# Patient Record
Sex: Female | Born: 1970 | Race: White | Hispanic: No | Marital: Single | Smoking: Never smoker
Health system: Southern US, Community
[De-identification: ages and names within clinical notes are randomized; demographics above are authoritative.]

## PROBLEM LIST (undated history)

## (undated) HISTORY — PX: ANKLE SURGERY: SHX546

---

## 1998-11-18 ENCOUNTER — Emergency Department (HOSPITAL_COMMUNITY): Admission: EM | Admit: 1998-11-18 | Discharge: 1998-11-18 | Payer: Self-pay | Admitting: Emergency Medicine

## 2004-08-04 ENCOUNTER — Ambulatory Visit: Payer: Self-pay | Admitting: Internal Medicine

## 2004-08-23 ENCOUNTER — Ambulatory Visit: Payer: Self-pay | Admitting: Internal Medicine

## 2004-10-13 ENCOUNTER — Ambulatory Visit: Admission: RE | Admit: 2004-10-13 | Discharge: 2004-10-13 | Payer: Self-pay | Admitting: Family Medicine

## 2004-10-13 ENCOUNTER — Emergency Department (HOSPITAL_COMMUNITY): Admission: EM | Admit: 2004-10-13 | Discharge: 2004-10-13 | Payer: Self-pay | Admitting: Family Medicine

## 2007-08-23 ENCOUNTER — Inpatient Hospital Stay (HOSPITAL_COMMUNITY): Admission: EM | Admit: 2007-08-23 | Discharge: 2007-08-25 | Payer: Self-pay | Admitting: Emergency Medicine

## 2007-10-10 ENCOUNTER — Encounter: Admission: RE | Admit: 2007-10-10 | Discharge: 2007-11-05 | Payer: Self-pay | Admitting: Orthopedic Surgery
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
## 2010-09-20 ENCOUNTER — Emergency Department (HOSPITAL_BASED_OUTPATIENT_CLINIC_OR_DEPARTMENT_OTHER)
Admission: EM | Admit: 2010-09-20 | Discharge: 2010-09-20 | Payer: Self-pay | Source: Home / Self Care | Admitting: Emergency Medicine

## 2011-02-15 NOTE — Discharge Summary (Signed)
NAME:  Patricia Gallagher, Patricia Gallagher           ACCOUNT NO.:  [id number]   MEDICAL RECORD NO.:  [id number]          PATIENT TYPE:  INP   LOCATION:  5003                         FACILITY:  [facility]   PHYSICIAN:  Mila Homer. Sherlean Foot, M.D. DATE OF BIRTH:  21-Jan-1971   DATE OF ADMISSION:  08/23/2007  DATE OF DISCHARGE:  08/25/2007                               DISCHARGE SUMMARY   ADMISSION DIAGNOSES:  1. Left trimalleolar ankle fracture.  2. Asthma.   DISCHARGE DIAGNOSES:  1. Left trimalleolar ankle fracture.  2. Asthma.   SURGICAL PROCEDURES:  On August 24, 2007,  Patricia Gallagher underwent an  open reduction internal fixation (ORIF) of her left ankle fracture by  Mila Homer. Sherlean Foot, M.D., assisted by Legrand Pitts. Duffy, P.A.   COMPLICATIONS:  None.   CONSULTANT:  1. Physical Therapy and Case Management consults on August 25, 2007.   HISTORY OF PRESENT ILLNESS:  This 40 year old white female patient  presented to the Emergency Department with complaints of left ankle pain  after twisting her ankle about 4 hours ago.  The pain is located about  the ankle, has not been getting better, and cannot put weight on it.  X-  rays show a trimalleolar ankle fracture and because of that she is being  admitted for surgical fixation of the fracture.   HOSPITAL COURSE:  Patricia Gallagher was admitted via the Emergency Room and  placed on the Orthopedic floor.  Plans were made for her surgery the  next day.  She tolerated that well.  On postoperative day #1, the 22nd,  she was voiding without difficulty, afebrile, and vitals stable.  Hemoglobin 12.8, hematocrit 37.9.  She was switched to p.o. pain  medications, started on therapy, and it was felt she was doing well  enough for discharge home later that day.  She was discharged home at  that time.   DIET:  She can resume her regular prehospitalization diet.   DISCHARGE MEDICATIONS:  1. She may continue her albuterol inhaler 1 puffs inhaled q.4 h.      p.r.n. shortness of  breath.  Additional medications at this time include:  1. Percocet 5/325 one to two p.o. q.4 h. p.r.n. pain, #50, with no      refill.  2. Robaxin 500 mg 1 to 2 tablets p.o. q.6 h. p.r.n. spasms.  3. Baby aspirin 1 b.i.d. for 1 month.   DISCHARGE INSTRUCTIONS:  She can increase her activity slowly.  Crutches and walker are to be used.  She is to be nonweightbearing on the left leg with the use of those.  No lifting for 6 weeks.  No driving for 6 weeks.   WOUND CARE:  1. She is to keep the left leg splint clean and dry and elevated above      the heart as much as possible.  2. She is to use ice on the ankle as needed for the next 48 to 72      hours.  3. She is to notify Dr. Sherlean Foot of temperature greater than 101.5,      chills, pain unrelieved by pain medications, or foul-smelling  drainage from the wound.   FOLLOWUP:  She is to follow up with Dr. Sherlean Foot in our office in about 1  week and needs to call [phone] for that appointment.   LABORATORY DATA:  X-ray taken of the left ankle on November 20 shows a  transverse medial malleolar fracture, which is displaced laterally with  an oblique distal fibular metaphacele fracture, displaced laterally and  posteriorly.  There is some lateral subluxation of the talus.   White count was 13.8 on the 21st and 14.2 on the 22nd.   Potassium was 2.9 on the 21st, went to 3.5 that day, and then 3.7 on the  22nd.  Sodium dropped to a low of 134 on the 21st and then was within  normal limits.  Glucose ranged from 150 on the 21st to 101 on the 22nd.  BUN and creatinine were 2 and 0.69 on the 21st to less than 1 and 0.52  on the 22nd.   Albumin was low at 3.2 on the 21st.  All other laboratory studies were  within normal limits.      Legrand Pitts Duffy, P.A.    ______________________________  Mila Homer. Sherlean Foot, M.D.    KED/MEDQ  D:  10/16/2007  T:  10/16/2007  Job:  [id number]

## 2011-02-18 NOTE — Op Note (Signed)
NAME:  Folden, Heddy           ACCOUNT NO.:  [id number]   MEDICAL RECORD NO.:  [id number]          PATIENT TYPE:  INP   LOCATION:  5003                         FACILITY:  [facility]   PHYSICIAN:  Mila Homer. Sherlean Foot, M.D. DATE OF BIRTH:  02/15/1971   DATE OF PROCEDURE:  DATE OF DISCHARGE:  08/25/2007                               OPERATIVE REPORT   INDICATIONS FOR PROCEDURE:  Ms. Malesky comes in with a fractured ankle,  trimalleolar, into the emergency room, cleared for surgery and taken to  the operating room.   PREOPERATIVE DIAGNOSIS:  Left ankle fracture.   POSTOPERATIVE DIAGNOSIS:  Left ankle fracture.   PROCEDURE PERFORMED:  Left bimalleolar open reduction and internal  fixation.   DESCRIPTION OF PROCEDURE:  The patient was laid supine and administered  general anesthesia.  The left lower extremity was prepped and draped in  the usual sterile fashion.  A lateral incision was made with a #10  blade, 6 inches in length.  Sharp dissection was taken down to the  lateral fibula.  There was a very comminuted fracture.  I did a  reduction using a 10-0 plate, two holes distally with cancellous screws,  four holes proximally, and two lag screws through the plate.   Then the leg was then closed with buried 0 subcuticular and 2-0's and  skin staples.  We then made a medial hockey stick incision 3 inches in  length directly over the fracture in the middle joint.  I took that down  to the fracture, cleaned out the fracture, anatomically reduced it with  two 2.0-mm pins and replaced those pins with 40-mm partially threaded  cancellous screws.  This fixed that fracture very, very well.  Then  irrigated and closed that with 0 and 2-0 Vicryl sutures as well and skin  staples.  Dressed with Xeroform and placed a pressure splint.   TOURNIQUET TIME:  Tourniquet time was 24 minutes.   COMPLICATIONS:  None.   DRAINS:  None.           ______________________________  Mila Homer. Sherlean Foot,  M.D.     SDL/MEDQ  D:  08/28/2007  T:  08/29/2007  Job:  [id number]

## 2011-07-12 LAB — BASIC METABOLIC PANEL WITH GFR
BUN: <1 — ABNORMAL LOW
CO2: 27
Calcium: 8.5
Chloride: 105
Creatinine, Ser: 0.52
GFR calc non Af Amer: >60
Glucose, Bld: 101 — ABNORMAL HIGH
Potassium: 3.7
Sodium: 137

## 2011-07-12 LAB — CBC
HCT: 37
HCT: 37.9
Hemoglobin: 12.8
MCHC: 33.5
MCHC: 33.6
MCV: 85.1
MCV: 86.3
Platelets: 327
Platelets: 374
RBC: 4.4
RDW: 13.2
RDW: 13.4
WBC: 14.2 — ABNORMAL HIGH

## 2011-07-12 LAB — COMPREHENSIVE METABOLIC PANEL WITH GFR
ALT: 13
ALT: 15
AST: 16
AST: 19
Albumin: 3.2 — ABNORMAL LOW
Albumin: 3.2 — ABNORMAL LOW
Alkaline Phosphatase: 77
Alkaline Phosphatase: 79
BUN: 1 — ABNORMAL LOW
BUN: 2 — ABNORMAL LOW
CO2: 24
CO2: 26
Calcium: 8.1 — ABNORMAL LOW
Calcium: 8.4
Chloride: 100
Chloride: 104
Creatinine, Ser: 0.57
Creatinine, Ser: 0.69
GFR calc non Af Amer: >60
GFR calc non Af Amer: >60
Glucose, Bld: 108 — ABNORMAL HIGH
Glucose, Bld: 150 — ABNORMAL HIGH
Potassium: 2.9 — ABNORMAL LOW
Potassium: 3.8
Sodium: 135
Sodium: 138
Total Bilirubin: 0.3
Total Bilirubin: 0.5
Total Protein: 6.5
Total Protein: 6.7

## 2011-07-12 LAB — POCT I-STAT 4, (NA,K, GLUC, HGB,HCT)
Glucose, Bld: 108 — ABNORMAL HIGH
HCT: 38
Hemoglobin: 12.9
Operator id: 177011
Potassium: 3.5
Sodium: 134 — ABNORMAL LOW

## 2011-07-12 LAB — PROTIME-INR: INR: 1

## 2011-07-12 LAB — APTT: aPTT: 29

## 2016-09-29 ENCOUNTER — Other Ambulatory Visit: Payer: Self-pay | Admitting: Family Medicine

## 2016-09-29 DIAGNOSIS — Z1231 Encounter for screening mammogram for malignant neoplasm of breast: Secondary | ICD-10-CM

## 2022-11-26 ENCOUNTER — Ambulatory Visit (HOSPITAL_COMMUNITY)
Admission: EM | Admit: 2022-11-26 | Discharge: 2022-11-26 | Disposition: A | Discharge status: Home / Self Care | Payer: Self-pay | Attending: Emergency Medicine | Admitting: Emergency Medicine

## 2022-11-26 ENCOUNTER — Encounter (HOSPITAL_COMMUNITY): Payer: Self-pay

## 2022-11-26 DIAGNOSIS — J069 Acute upper respiratory infection, unspecified: Secondary | ICD-10-CM | POA: Insufficient documentation

## 2022-11-26 DIAGNOSIS — Z1152 Encounter for screening for COVID-19: Secondary | ICD-10-CM | POA: Insufficient documentation

## 2022-11-26 DIAGNOSIS — R059 Cough, unspecified: Secondary | ICD-10-CM | POA: Insufficient documentation

## 2022-11-26 MED ORDER — CETIRIZINE HCL 10 MG PO TABS: 10.0000 mg | ORAL_TABLET | Freq: Every day | ORAL | 2 refills | Status: AC

## 2022-11-26 NOTE — Discharge Instructions (Addendum)
Continue symptomatic care I recommend once daily zyrtec as well Drink lots of fluids  We will call you if your covid test returns positive.

## 2022-11-26 NOTE — ED Provider Notes (Signed)
[place]    CSN: [id number] Arrival date & time: 11/26/22  1003      History   Chief Complaint Chief Complaint  Patient presents with   Cough    HPI Patricia Gallagher is a 52 y.o. female.  Presents with cough and congestion that started yesterday Cough is dry One episode of post-tussive emesis No fevers Tolerating fluids Has taken dayquil and nyquil No known sick contacts  Work requiring a note  History reviewed. No pertinent past medical history.  There are no problems to display for this patient.   Past Surgical History:  Procedure Laterality Date   ANKLE SURGERY Left     OB History   No obstetric history on file.      Home Medications    Prior to Admission medications   Medication Sig Start Date End Date Taking? Authorizing Provider  cetirizine (ZYRTEC ALLERGY) 10 MG tablet Take 1 tablet (10 mg total) by mouth daily. 11/26/22  Yes Tashe Purdon, Vernice Jefferson    Family History History reviewed. No pertinent family history.  Social History Social History   Tobacco Use   Smoking status: Never    Passive exposure: Never   Smokeless tobacco: Never  Vaping Use   Vaping Use: Never used  Substance Use Topics   Alcohol use: Not Currently   Drug use: Never     Allergies   Patient has no known allergies.   Review of Systems Review of Systems As per HPI  Physical Exam Triage Vital Signs ED Triage Vitals  Enc Vitals Group     BP 11/26/22 1019 (!) 157/101     Pulse Rate 11/26/22 1019 89     Resp 11/26/22 1019 16     Temp 11/26/22 1019 98.4 F (36.9 C)     Temp Source 11/26/22 1019 Oral     SpO2 11/26/22 1019 97 %     Weight 11/26/22 1020 175 lb (79.4 kg)     Height 11/26/22 1020 '5\' 8"'$  (1.727 m)     Head Circumference --      Peak Flow --      Pain Score 11/26/22 1020 0     Pain Loc --      Pain Edu? --      Excl. in [place]? --    No data found.  Updated Vital Signs BP (!) 157/101 (BP Location: Right Arm)   Pulse 89   Temp  98.4 F (36.9 C) (Oral)   Resp 16   Ht '5\' 8"'$  (1.727 m)   Wt 175 lb (79.4 kg)   LMP 11/10/2022 (Approximate)   SpO2 97%   BMI 26.61 kg/m   Physical Exam Vitals and nursing note reviewed.  Constitutional:      General: She is not in acute distress. HENT:     Nose: No congestion or rhinorrhea.     Mouth/Throat:     Mouth: Mucous membranes are moist.     Pharynx: Oropharynx is clear. No posterior oropharyngeal erythema.  Eyes:     Conjunctiva/sclera: Conjunctivae normal.  Cardiovascular:     Rate and Rhythm: Normal rate and regular rhythm.     Pulses: Normal pulses.     Heart sounds: Normal heart sounds.  Pulmonary:     Effort: Pulmonary effort is normal.     Breath sounds: Normal breath sounds.  Musculoskeletal:     Cervical back: Normal range of motion.  Lymphadenopathy:     Cervical: No cervical adenopathy.  Skin:  General: Skin is warm and dry.  Neurological:     Mental Status: She is alert and oriented to person, place, and time.     UC Treatments / Results  Labs (all labs ordered are listed, but only abnormal results are displayed) Labs Reviewed  SARS CORONAVIRUS 2 (TAT 6-24 HRS)    EKG   Radiology No results found.  Procedures Procedures (including critical care time)  Medications Ordered in UC Medications - No data to display  Initial Impression / Assessment and Plan / UC Course  I have reviewed the triage vital signs and the nursing notes.  Pertinent labs & imaging results that were available during my care of the patient were reviewed by me and considered in my medical decision making (see chart for details).  Afebrile, well appearing. Clear lungs. Covid test pending. No antivirals if positive Discussed symptomatic care at home Work note provided Return precautions discussed. Patient agrees to plan  Final Clinical Impressions(s) / UC Diagnoses   Final diagnoses:  Viral URI with cough     Discharge Instructions      Continue  symptomatic care I recommend once daily zyrtec as well Drink lots of fluids  We will call you if your covid test returns positive.      ED Prescriptions     Medication Sig Dispense Auth. Provider   cetirizine (ZYRTEC ALLERGY) 10 MG tablet Take 1 tablet (10 mg total) by mouth daily. 30 tablet Lorra Freeman, Wells Guiles, PA-C      PDMP not reviewed this encounter.   Daysean Tinkham, Wells Guiles, [place] 11/26/22 1053

## 2022-11-26 NOTE — ED Triage Notes (Signed)
Pt states cough,congestion,runny nose since yesterday,states she vomited once yesterday. Has been taking Dayquil and Nyquil at home.

## 2022-11-27 LAB — SARS CORONAVIRUS 2 (TAT 6-24 HRS): SARS Coronavirus 2: NEGATIVE

## 2024-05-10 ENCOUNTER — Emergency Department (HOSPITAL_COMMUNITY)
Admission: EM | Admit: 2024-05-10 | Discharge: 2024-05-11 | Disposition: A | Discharge status: Home / Self Care | Payer: Self-pay | Attending: Emergency Medicine | Admitting: Emergency Medicine

## 2024-05-10 DIAGNOSIS — F10929 Alcohol use, unspecified with intoxication, unspecified: Secondary | ICD-10-CM

## 2024-05-10 DIAGNOSIS — F10920 Alcohol use, unspecified with intoxication, uncomplicated: Secondary | ICD-10-CM | POA: Diagnosis not present

## 2024-05-10 DIAGNOSIS — E876 Hypokalemia: Secondary | ICD-10-CM | POA: Insufficient documentation

## 2024-05-10 DIAGNOSIS — Y908 Blood alcohol level of 240 mg/100 ml or more: Secondary | ICD-10-CM | POA: Insufficient documentation

## 2024-05-10 DIAGNOSIS — R1110 Vomiting, unspecified: Secondary | ICD-10-CM | POA: Diagnosis present

## 2024-05-10 DIAGNOSIS — R739 Hyperglycemia, unspecified: Secondary | ICD-10-CM

## 2024-05-10 LAB — COMPREHENSIVE METABOLIC PANEL WITH GFR
ALT: 12 U/L (ref 0–44)
AST: 16 U/L (ref 15–41)
Albumin: 3.4 g/dL — ABNORMAL LOW (ref 3.5–5.0)
Alkaline Phosphatase: 78 U/L (ref 38–126)
Anion gap: 12 (ref 5–15)
BUN: 7 mg/dL (ref 6–20)
CO2: 19 mmol/L — ABNORMAL LOW (ref 22–32)
Calcium: 8.2 mg/dL — ABNORMAL LOW (ref 8.9–10.3)
Chloride: 107 mmol/L (ref 98–111)
Creatinine, Ser: 0.65 mg/dL (ref 0.44–1.00)
GFR, Estimated: >60 mL/min (ref >60)
Glucose, Bld: 121 mg/dL — ABNORMAL HIGH (ref 70–99)
Potassium: 3.1 mmol/L — ABNORMAL LOW (ref 3.5–5.1)
Sodium: 138 mmol/L (ref 135–145)
Total Bilirubin: 0.2 mg/dL (ref 0.0–1.2)
Total Protein: 6.8 g/dL (ref 6.5–8.1)

## 2024-05-10 LAB — CBC
HCT: 40.5 % (ref 36.0–46.0)
Hemoglobin: 12.7 g/dL (ref 12.0–15.0)
MCH: 28 pg (ref 26.0–34.0)
MCHC: 31.4 g/dL (ref 30.0–36.0)
MCV: 89.2 fL (ref 80.0–100.0)
Platelets: 281 K/uL (ref 150–400)
RBC: 4.54 MIL/uL (ref 3.87–5.11)
RDW: 14.5 % (ref 11.5–15.5)
WBC: 11.1 K/uL — ABNORMAL HIGH (ref 4.0–10.5)
nRBC: 0 % (ref 0.0–0.2)

## 2024-05-10 LAB — ETHANOL: Alcohol, Ethyl (B): 315 mg/dL (ref <15)

## 2024-05-10 MED ORDER — POTASSIUM CHLORIDE CRYS ER 20 MEQ PO TBCR
40.0000 meq | EXTENDED_RELEASE_TABLET | Freq: Once | ORAL | Status: AC
  Administered 2024-05-11: 40 meq via ORAL
  Filled 2024-05-10: qty 2

## 2024-05-10 MED ORDER — LACTATED RINGERS IV BOLUS
1000.0000 mL | Freq: Once | INTRAVENOUS | Status: AC
  Administered 2024-05-10: 1000 mL via INTRAVENOUS

## 2024-05-10 MED ORDER — THIAMINE HCL 100 MG/ML IJ SOLN
100.0000 mg | Freq: Once | INTRAMUSCULAR | Status: AC
  Administered 2024-05-10: 100 mg via INTRAVENOUS
  Filled 2024-05-10: qty 2

## 2024-05-10 NOTE — ED Provider Notes (Signed)
 Care assumed from Dr. Randol, patient with alcohol intoxication being observed until she is safe for discharge.  She was observed overnight without incident.  She was able to ambulate without difficulty and was felt to be safe for discharge.  I am giving her resources for outpatient detox programs if she is interested.   Raford Lenis, MD 05/11/24 [phone]

## 2024-05-10 NOTE — ED Triage Notes (Signed)
 Pt came in via EMS from home w/ c/o of alcohol intoxication. Pt reports have two 40s and a fifth. Pt had emesis episode and boyfriend grew concern due to pt becoming minimal responsive. Pt has no complaints at the moment.   4 mg zofran 500 ml saline

## 2024-05-10 NOTE — ED Provider Notes (Signed)
 [place] EMERGENCY DEPARTMENT AT [facility] Provider Note   CSN: [id number] Arrival date & time: 05/10/24  2137     Patient presents with: Alcohol Intoxication   Patricia Gallagher is a 53 y.o. female.    Alcohol Intoxication     Patient presented to the ED for evaluation of alcohol intoxication.  According to the EMS report patient drank 240s at home as well as 1/5.  She began vomiting.  Her boyfriend was concerned so he called EMS.  Patient denies complaints.  She admits to drinking alcohol.  Patient does not want to answer many questions and proceeds to pull the covers over her  Prior to Admission medications   Medication Sig Start Date End Date Taking? Authorizing Provider  cetirizine  (ZYRTEC  ALLERGY) 10 MG tablet Take 1 tablet (10 mg total) by mouth daily. 11/26/22   Rising, [place], PA-C    Allergies: Patient has no known allergies.    Review of Systems  Updated Vital Signs BP (!) 129/94   Pulse 74   Temp 97.7 F (36.5 C) (Oral)   Resp 14   SpO2 94%   Physical Exam Vitals and nursing note reviewed.  Constitutional:      Appearance: She is well-developed. She is not ill-appearing or diaphoretic.  HENT:     Head: Normocephalic and atraumatic.     Comments: EtOH odor on the breath    Right Ear: External ear normal.     Left Ear: External ear normal.  Eyes:     General: No scleral icterus.       Right eye: No discharge.        Left eye: No discharge.     Conjunctiva/sclera: Conjunctivae normal.  Neck:     Trachea: No tracheal deviation.  Cardiovascular:     Rate and Rhythm: Normal rate and regular rhythm.  Pulmonary:     Effort: Pulmonary effort is normal. No respiratory distress.     Breath sounds: Normal breath sounds. No stridor. No wheezing or rales.  Abdominal:     General: Bowel sounds are normal. There is no distension.     Palpations: Abdomen is soft.     Tenderness: There is no abdominal tenderness. There is no guarding or rebound.   Musculoskeletal:        General: No tenderness or deformity.     Cervical back: Neck supple.  Skin:    General: Skin is warm and dry.     Findings: No rash.     Comments: No signs of injury, no lacerations no bruising  Neurological:     General: No focal deficit present.     Mental Status: She is alert.     Cranial Nerves: No cranial nerve deficit, dysarthria or facial asymmetry.     Sensory: No sensory deficit.     Motor: No abnormal muscle tone or seizure activity.     Coordination: Coordination normal.  Psychiatric:        Mood and Affect: Mood normal.     (all labs ordered are listed, but only abnormal results are displayed) Labs Reviewed  CBC - Abnormal; Notable for the following components:      Result Value   WBC 11.1 (*)    All other components within normal limits  COMPREHENSIVE METABOLIC PANEL WITH GFR - Abnormal; Notable for the following components:   Potassium 3.1 (*)    CO2 19 (*)    Glucose, Bld 121 (*)    Calcium 8.2 (*)  Albumin 3.4 (*)    All other components within normal limits  ETHANOL    EKG: None  Radiology: No results found.   Procedures   Medications Ordered in the ED  potassium chloride  SA (KLOR-CON  M) CR tablet 40 mEq (has no administration in time range)  thiamine  (VITAMIN B1) injection 100 mg (100 mg Intravenous Given 05/10/24 2254)  lactated ringers  bolus 1,000 mL (1,000 mLs Intravenous New Bag/Given 05/10/24 2255)                                    Medical Decision Making Amount and/or Complexity of Data Reviewed Labs: ordered.  Risk Prescription drug management.   Patient presents to the ED for evaluation of alcohol intoxication.  Patient noted to have significantly elevated alcohol level.  No signs of trauma.  Potassium level decreased.  Slight decrease in bicarb.  IV fluids and thiamine  have been ordered.  Will continue to monitor until patient is clinically sober.  Care turned over to Dr Raford     Final diagnoses:   Alcoholic intoxication with complication [facility])  Hypokalemia    ED Discharge Orders     None          Randol Simmonds, MD 05/10/24 2350

## 2024-05-11 MED ORDER — POTASSIUM CHLORIDE CRYS ER 20 MEQ PO TBCR: 20.0000 meq | EXTENDED_RELEASE_TABLET | Freq: Two times a day (BID) | ORAL | 0 refills | Status: AC

## 2024-05-11 NOTE — ED Notes (Addendum)
 Pt ambulated to bathroom and back to room independently. Tolerated well
